# Patient Record
Sex: Female | Born: 1990 | Race: Black or African American | Hispanic: No | Marital: Single | Smoking: Never smoker
Health system: Southern US, Community
[De-identification: ages and names within clinical notes are randomized; demographics above are authoritative.]

---

## 2016-05-16 ENCOUNTER — Ambulatory Visit (HOSPITAL_COMMUNITY)
Admission: EM | Admit: 2016-05-16 | Discharge: 2016-05-16 | Disposition: A | Discharge status: Home / Self Care | Payer: BLUE CROSS/BLUE SHIELD

## 2016-05-16 ENCOUNTER — Encounter (HOSPITAL_COMMUNITY): Payer: Self-pay | Admitting: Emergency Medicine

## 2016-05-16 DIAGNOSIS — B9789 Other viral agents as the cause of diseases classified elsewhere: Secondary | ICD-10-CM

## 2016-05-16 DIAGNOSIS — R05 Cough: Secondary | ICD-10-CM

## 2016-05-16 DIAGNOSIS — J069 Acute upper respiratory infection, unspecified: Secondary | ICD-10-CM | POA: Diagnosis not present

## 2016-05-16 DIAGNOSIS — L0291 Cutaneous abscess, unspecified: Secondary | ICD-10-CM | POA: Diagnosis not present

## 2016-05-16 DIAGNOSIS — R059 Cough, unspecified: Secondary | ICD-10-CM | POA: Diagnosis present

## 2016-05-16 NOTE — ED Triage Notes (Signed)
The patient presented to the [facility] with a complaint of a cough and allergy symptoms x 5 days.  The patient also stated that she has an abscess in her groin and has been prescribed medication and wants to make sure it is the same medication.

## 2016-05-16 NOTE — Discharge Instructions (Signed)
Rest,push fluids. May take OTC meds for s/s management( allergra or zyrtec or claritin-choose 1, flonase nasal spray). Follow up with your PCP next week if symptoms worsen. Return to UC as needed.

## 2016-05-16 NOTE — ED Provider Notes (Signed)
CSN: [id number]     Arrival date & time 05/16/16  1001 History   First MD Initiated Contact with Patient 05/16/16 1024     Chief Complaint  Patient presents with  . Cough  . Abscess   (Consider location/radiation/quality/duration/timing/severity/associated sxs/prior Treatment) The history is provided by the patient. No language interpreter was used.  Cough  Cough characteristics:  Non-productive Severity:  Mild Onset quality:  Gradual Duration:  1 week Timing:  Constant Progression:  Improving Chronicity:  New (pt states she has had cold for 1 week, cough, ciongestion,improving, needs work note. Also wants to double check abx on for abscess(Bactrim)) Smoker: no   Context: sick contacts   Relieved by: OTC med. Worsened by:  Environmental changes Associated symptoms: sinus congestion   Associated symptoms: no chest pain, no chills, no diaphoresis, no ear fullness, no ear pain, no eye discharge, no fever, no headaches, no myalgias, no rash, no rhinorrhea, no shortness of breath, no sore throat, no weight loss and no wheezing   Risk factors: recent infection   Risk factors: no recent travel     History reviewed. No pertinent past medical history. History reviewed. No pertinent surgical history. History reviewed. No pertinent family history. Social History  Substance Use Topics  . Smoking status: Never Smoker  . Smokeless tobacco: Never Used  . Alcohol use Yes   OB History    No data available     Review of Systems  Constitutional: Negative for chills, diaphoresis, fever and weight loss.  HENT: Positive for sinus pressure and sneezing. Negative for ear pain, rhinorrhea and sore throat.   Eyes: Negative for discharge.  Respiratory: Positive for cough. Negative for apnea, choking, chest tightness, shortness of breath, wheezing and stridor.   Cardiovascular: Negative for chest pain.  Musculoskeletal: Negative for myalgias.  Skin: Negative for rash.       Groin abscess-seen  and treated by PCP, currently on Bactrim, hx of same  Neurological: Negative for headaches.  All other systems reviewed and are negative.   Allergies  Patient has no known allergies.  Home Medications   Prior to Admission medications   Medication Sig Start Date End Date Taking? Authorizing Provider  sulfamethoxazole-trimethoprim (BACTRIM DS,SEPTRA DS) 800-160 MG tablet Take 1 tablet by mouth 2 (two) times daily.   Yes Historical Provider, MD   Meds Ordered and Administered this Visit  Medications - No data to display  BP 115/75 (BP Location: Left Arm)   Pulse 89   Temp 98.3 F (36.8 C) (Oral)   Resp 16   SpO2 100%  No data found.   Physical Exam  Constitutional: She is oriented to person, place, and time. Vital signs are normal. She appears well-developed and well-nourished. She is active and cooperative. No distress.  HENT:  Head: Normocephalic.  Right Ear: Tympanic membrane normal.  Left Ear: Tympanic membrane normal.  Nose: Mucosal edema present. Right sinus exhibits no maxillary sinus tenderness and no frontal sinus tenderness. Left sinus exhibits no maxillary sinus tenderness and no frontal sinus tenderness.  Mouth/Throat: Uvula is midline.  Eyes: Lids are normal. Pupils are equal, round, and reactive to light.  Neck: Trachea normal and normal range of motion.  Cardiovascular: Normal rate, regular rhythm, S1 normal, normal heart sounds and normal pulses.   Pulmonary/Chest: Effort normal and breath sounds normal.  Musculoskeletal: Normal range of motion. She exhibits no edema or tenderness.  Lymphadenopathy:    She has no cervical adenopathy.  Neurological: She is alert and oriented  to person, place, and time. No cranial nerve deficit or sensory deficit. GCS eye subscore is 4. GCS verbal subscore is 5. GCS motor subscore is 6.  Skin: Skin is warm and dry. Lesion noted. No rash noted. No erythema.  Psychiatric: She has a normal mood and affect. Her speech is normal and  behavior is normal.  Nursing note and vitals reviewed.   Urgent Care Course     Procedures (including critical care time)  Labs Review Labs Reviewed - No data to display  Imaging Review No results found.      MDM   1. Cough   2. Viral URI with cough   3. Abscess    1059: Discussed plan of care with pt: VSS, no SB, improving, no imaging at present. Rest,push fluids. May take OTC meds for s/s management( allergra or zyrtec or claritin-choose 1, flonase nasal spray). Follow up with your PCP next week if symptoms worsen. Return to UC as needed.Pt verbalized understanding to this provider.    Clancy Gourd, NP 05/16/16 1116

## 2016-12-08 ENCOUNTER — Encounter (HOSPITAL_COMMUNITY): Payer: Self-pay | Admitting: Emergency Medicine

## 2016-12-08 ENCOUNTER — Emergency Department (HOSPITAL_COMMUNITY): Payer: No Typology Code available for payment source

## 2016-12-08 ENCOUNTER — Emergency Department (HOSPITAL_COMMUNITY)
Admission: EM | Admit: 2016-12-08 | Discharge: 2016-12-08 | Disposition: A | Discharge status: Home / Self Care | Payer: No Typology Code available for payment source | Attending: Emergency Medicine | Admitting: Emergency Medicine

## 2016-12-08 DIAGNOSIS — Y999 Unspecified external cause status: Secondary | ICD-10-CM | POA: Insufficient documentation

## 2016-12-08 DIAGNOSIS — Y9389 Activity, other specified: Secondary | ICD-10-CM | POA: Insufficient documentation

## 2016-12-08 DIAGNOSIS — S39012A Strain of muscle, fascia and tendon of lower back, initial encounter: Secondary | ICD-10-CM | POA: Insufficient documentation

## 2016-12-08 DIAGNOSIS — Y929 Unspecified place or not applicable: Secondary | ICD-10-CM | POA: Insufficient documentation

## 2016-12-08 LAB — POC URINE PREG, ED: Preg Test, Ur: NEGATIVE

## 2016-12-08 MED ORDER — CYCLOBENZAPRINE HCL 10 MG PO TABS: 10.0000 mg | ORAL_TABLET | Freq: Two times a day (BID) | ORAL | 0 refills | Status: AC | PRN

## 2016-12-08 MED ORDER — NAPROXEN 500 MG PO TABS: 500.0000 mg | ORAL_TABLET | Freq: Two times a day (BID) | ORAL | 0 refills | Status: AC
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
## 2016-12-08 NOTE — Discharge Instructions (Signed)
Please read the attached information regarding her condition. Take Flexeril and naproxen as needed for pain and spasms. Apply heating pad to affected area and stretch as tolerated. Follow-up with your primary care provider and further evaluation. Return to ED for worsening back pain, trouble walking, additional injury, numbness in legs, loss of bladder function, severe chest pain or trouble breathing.

## 2016-12-08 NOTE — ED Notes (Signed)
Pt aware of urine sample needed.

## 2017-12-25 ENCOUNTER — Encounter: Payer: Self-pay | Admitting: Obstetrics and Gynecology

## 2017-12-25 ENCOUNTER — Ambulatory Visit: Payer: Self-pay | Admitting: Obstetrics and Gynecology

## 2017-12-25 VITALS — BP 128/90 | HR 76 | Temp 98.7°F | Wt 171.8 lb

## 2017-12-25 DIAGNOSIS — H6690 Otitis media, unspecified, unspecified ear: Secondary | ICD-10-CM | POA: Insufficient documentation

## 2017-12-25 MED ORDER — AMOXICILLIN-POT CLAVULANATE 875-125 MG PO TABS: 1.0000 | ORAL_TABLET | Freq: Two times a day (BID) | ORAL | 0 refills | Status: DC

## 2017-12-25 NOTE — Progress Notes (Signed)
  Subjective:     Patient ID: Holly LevansAlysha Yvon Conrad, female   DOB: 27-Jan-1990, 27 y.o.   MRN: [id number]  HPI  Holly Conrad is a 27 y.o. female here with Left ear pain that started today. She vomited which helped her ear pain. She vomited because the pain was so intense. She has also noticed a sore throat since Tuesday. The throat is worse on the left side. She was taking OTC cold medicine which helped some. Currently she rates her Left ear pain 8.5/10. No ear drainage.   Review of Systems  Constitutional: Negative for chills and fever.  HENT: Positive for ear pain, facial swelling and hearing loss (Muffled hearing in left ear ).    Objective:   Physical Exam  Constitutional: She appears well-developed and well-nourished.  Non-toxic appearance. She does not have a sickly appearance. She does not appear ill. No distress.  HENT:  Head: Normocephalic.  Right Ear: Hearing normal. There is swelling. Tympanic membrane is erythematous. Tympanic membrane is not bulging.  Left Ear: There is swelling and tenderness. No drainage. Tympanic membrane is injected, erythematous and bulging. Tympanic membrane is not perforated. A middle ear effusion is present. Decreased hearing is noted.  Lymphadenopathy:    She has cervical adenopathy.  Skin: She is not diaphoretic.   Assessment:   1. Acute otitis media, unspecified otitis media type    Plan:   - Rx: Augmentin - Ibuprofen for pain; take ibuprofen with food - Return if symptoms do not improve   Venia Carbonasch, Jennifer I, NP 12/25/2017 7:28 PM

## 2018-01-11 ENCOUNTER — Ambulatory Visit: Payer: Self-pay | Admitting: Physician Assistant

## 2018-01-11 VITALS — BP 104/60 | HR 84 | Temp 98.9°F | Wt 175.8 lb

## 2018-01-11 DIAGNOSIS — R300 Dysuria: Secondary | ICD-10-CM

## 2018-01-11 LAB — POC URINALSYSI DIPSTICK (AUTOMATED)
BILIRUBIN UA: NEGATIVE
Blood, UA: POSITIVE
Glucose, UA: NEGATIVE
Ketones, UA: NEGATIVE
LEUKOCYTES UA: NEGATIVE
NITRITE UA: NEGATIVE
Protein, UA: NEGATIVE
Spec Grav, UA: 1.02 (ref 1.010–1.025)
Urobilinogen, UA: 0.2 E.U./dL
pH, UA: 6 (ref 5.0–8.0)

## 2018-01-11 MED ORDER — NITROFURANTOIN MONOHYD MACRO 100 MG PO CAPS: 100.0000 mg | ORAL_CAPSULE | Freq: Two times a day (BID) | ORAL | 0 refills | Status: AC

## 2018-01-11 NOTE — Patient Instructions (Addendum)
Your history indicates you have a UTI. I have given you a prescription for an antibiotic. Please take with food. We do not obtain urine cultures at our office, therefore, if your symptoms worsen over the next 24-48 hours or you develop fever, chills, flank pian, nausea and vomiting, please seek care immediately.    Urinary Tract Infection, Adult A urinary tract infection (UTI) is an infection of any part of the urinary tract. The urinary tract includes:  The kidneys.  The ureters.  The bladder.  The urethra. These organs make, store, and get rid of pee (urine) in the body. What are the causes? This is caused by germs (bacteria) in your genital area. These germs grow and cause swelling (inflammation) of your urinary tract. What increases the risk? You are more likely to develop this condition if:  You have a small, thin tube (catheter) to drain pee.  You cannot control when you pee or poop (incontinence).  You are female, and: ? You use these methods to prevent pregnancy: ? A medicine that kills sperm (spermicide). ? A device that blocks sperm (diaphragm). ? You have low levels of a female hormone (estrogen). ? You are pregnant.  You have genes that add to your risk.  You are sexually active.  You take antibiotic medicines.  You have trouble peeing because of: ? A prostate that is bigger than normal, if you are female. ? A blockage in the part of your body that drains pee from the bladder (urethra). ? A kidney stone. ? A nerve condition that affects your bladder (neurogenic bladder). ? Not getting enough to drink. ? Not peeing often enough.  You have other conditions, such as: ? Diabetes. ? A weak disease-fighting system (immune system). ? Sickle cell disease. ? Gout. ? Injury of the spine. What are the signs or symptoms? Symptoms of this condition include:  Needing to pee right away (urgently).  Peeing often.  Peeing small amounts often.  Pain or burning when  peeing.  Blood in the pee.  Pee that smells bad or not like normal.  Trouble peeing.  Pee that is cloudy.  Fluid coming from the vagina, if you are female.  Pain in the belly or lower back. Other symptoms include:  Throwing up (vomiting).  No urge to eat.  Feeling mixed up (confused).  Being tired and grouchy (irritable).  A fever.  Watery poop (diarrhea). How is this treated? This condition may be treated with:  Antibiotic medicine.  Other medicines.  Drinking enough water. Follow these instructions at home:  Medicines  Take over-the-counter and prescription medicines only as told by your doctor.  If you were prescribed an antibiotic medicine, take it as told by your doctor. Do not stop taking it even if you start to feel better. General instructions  Make sure you: ? Pee until your bladder is empty. ? Do not hold pee for a long time. ? Empty your bladder after sex. ? Wipe from front to back after pooping if you are a female. Use each tissue one time when you wipe.  Drink enough fluid to keep your pee pale yellow.  Keep all follow-up visits as told by your doctor. This is important. Contact a doctor if:  You do not get better after 1-2 days.  Your symptoms go away and then come back. Get help right away if:  You have very bad back pain.  You have very bad pain in your lower belly.  You have a fever.  have a fever.  You are sick to your stomach (nauseous).  You are throwing up. Summary  A urinary tract infection (UTI) is an infection of any part of the urinary tract.  This condition is caused by germs in your genital area.  There are many risk factors for a UTI. These include having a small, thin tube to drain pee and not being able to control when you pee or poop.  Treatment includes antibiotic medicines for germs.  Drink enough fluid to keep your pee pale yellow. This information is not intended to replace advice given to you by your health care  provider. Make sure you discuss any questions you have with your health care provider. Document Released: 06/25/2007 Document Revised: 07/16/2017 Document Reviewed: 07/16/2017 Elsevier Interactive Patient Education  2019 [employer]  

## 2018-01-11 NOTE — Progress Notes (Signed)
01/11/2018 at 9:04 AM  Holly LevansAlysha Yvon Conrad / DOB: 1990/04/04 / MRN: [id number]  The patient has Cough; Viral URI with cough; Abscess; and Acute otitis media on their problem list.  SUBJECTIVE  Holly Conrad is a 27 y.o. female who complains of  dysuria, hematuria, urinary frequency, urinary urgency and suprapubic pressure x 1 day. She denies flank pain, pelvic pain, cloudy malordorous urine, genital rash, genital irritation and vaginal discharge. No recent increase in exercise.  Has not tried anything for relief. Most recent UTI prior to this was years ago. LMP 12/26/17. Sexually active with monogamous partner.   She  has no past medical history on file.    Medications reviewed and updated by myself where necessary, and exist elsewhere in the encounter.   Holly Conrad has No Known Allergies. She  reports that she has never smoked. She has never used smokeless tobacco. She reports current alcohol use. She reports that she does not use drugs. She  reports being sexually active. The patient  has no past surgical history on file.  Her family history is not on file.  Review of Systems  Constitutional: Negative for chills and fever.  Gastrointestinal: Negative for nausea and vomiting.    OBJECTIVE  Her  weight is 175 lb 12.8 oz (79.7 kg). Her temperature is 98.9 F (37.2 C). Her blood pressure is 104/60 and her pulse is 84. Her oxygen saturation is 98%.  The patient's body mass index is unknown because there is no height or weight on file.  Physical Exam Vitals signs reviewed.  Constitutional:      General: She is not in acute distress.    Appearance: Normal appearance. She is well-developed.  HENT:     Head: Normocephalic and atraumatic.  Eyes:     Conjunctiva/sclera: Conjunctivae normal.  Neck:     Musculoskeletal: Normal range of motion.  Pulmonary:     Effort: Pulmonary effort is normal.  Abdominal:     General: Bowel sounds are normal.     Palpations: Abdomen is soft.      Tenderness: There is no abdominal tenderness. There is no right CVA tenderness, left CVA tenderness, guarding or rebound.  Skin:    General: Skin is warm and dry.  Neurological:     Mental Status: She is alert and oriented to person, place, and time.     Results for orders placed or performed in visit on 01/11/18 (from the past 24 hour(s))  POCT Urinalysis Dipstick (Automated)     Status: None   Collection Time: 01/11/18  8:41 AM  Result Value Ref Range   Color, UA yellow    Clarity, UA clear    Glucose, UA Negative Negative   Bilirubin, UA neg    Ketones, UA neg    Spec Grav, UA 1.020 1.010 - 1.025   Blood, UA positive    pH, UA 6.0 5.0 - 8.0   Protein, UA Negative Negative   Urobilinogen, UA 0.2 0.2 or 1.0 E.U./dL   Nitrite, UA neg    Leukocytes, UA Negative Negative    ASSESSMENT & PLAN  Holly Conrad was seen today for dysuria and blood in urine.  Diagnoses and all orders for this visit:  Dysuria -     POCT Urinalysis Dipstick (Automated)  Other orders -     nitrofurantoin, macrocrystal-monohydrate, (MACROBID) 100 MG capsule; Take 1 capsule (100 mg total) by mouth 2 (two) times daily for 5 days.    Pt is overall well appearing, NAD.  VSS. POC UA with +RBC. No leukocytes or nitrites. However, in office UA machine has more recently been unreliable, unable to perform UA dipstick in office.  Hx suggestive of UTI.    Will treat empirically at this time.No concern for kidney stone or pyelonephritis. The patient was advised to follow up with PCP if f she does not see an improvement in symptoms, or worsens with the above plan.   Holly Conrad, Cordelia Poche-C  [facility] 01/11/2018 9:04 AM
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
